# Patient Record
Sex: Female | Born: 1935 | Marital: Married | Smoking: Never smoker
Health system: Southern US, Community
[De-identification: ages and names within clinical notes are randomized; demographics above are authoritative.]

## PROBLEM LIST (undated history)

## (undated) DIAGNOSIS — D696 Thrombocytopenia, unspecified: Secondary | ICD-10-CM

## (undated) DIAGNOSIS — J45909 Unspecified asthma, uncomplicated: Secondary | ICD-10-CM

## (undated) DIAGNOSIS — D649 Anemia, unspecified: Secondary | ICD-10-CM

## (undated) DIAGNOSIS — E039 Hypothyroidism, unspecified: Secondary | ICD-10-CM

## (undated) HISTORY — DX: Unspecified asthma, uncomplicated: J45.909

## (undated) HISTORY — DX: Hypothyroidism, unspecified: E03.9

## (undated) HISTORY — DX: Thrombocytopenia, unspecified: D69.6

## (undated) HISTORY — DX: Anemia, unspecified: D64.9

---

## 2007-05-04 ENCOUNTER — Ambulatory Visit: Payer: Self-pay | Admitting: Emergency Medicine

## 2009-01-19 ENCOUNTER — Emergency Department: Payer: Self-pay | Admitting: Emergency Medicine

## 2010-04-20 ENCOUNTER — Ambulatory Visit: Payer: Self-pay | Admitting: Ophthalmology

## 2010-08-03 ENCOUNTER — Ambulatory Visit: Payer: Self-pay | Admitting: Ophthalmology

## 2010-09-09 ENCOUNTER — Emergency Department: Payer: Self-pay | Admitting: Emergency Medicine

## 2011-06-04 ENCOUNTER — Emergency Department: Payer: Self-pay | Admitting: Emergency Medicine

## 2012-04-27 ENCOUNTER — Ambulatory Visit: Payer: Self-pay | Admitting: Internal Medicine

## 2012-10-17 ENCOUNTER — Ambulatory Visit: Payer: Self-pay | Admitting: Oncology

## 2012-10-17 LAB — FERRITIN: Ferritin (ARMC): 123 ng/mL (ref 8–388)

## 2012-10-17 LAB — LACTATE DEHYDROGENASE: LDH: 228 U/L (ref 81–246)

## 2012-10-17 LAB — CBC CANCER CENTER
Basophil #: 0.1 x10 3/mm (ref 0.0–0.1)
Basophil %: 1.2 %
Eosinophil %: 1.1 %
HCT: 23.9 % — ABNORMAL LOW (ref 35.0–47.0)
Lymphocyte #: 3.3 x10 3/mm (ref 1.0–3.6)
Lymphocyte %: 53.7 %
MCH: 28.8 pg (ref 26.0–34.0)
MCV: 91 fL (ref 80–100)
Monocyte #: 0.7 x10 3/mm (ref 0.2–0.9)
Neutrophil #: 1.9 x10 3/mm (ref 1.4–6.5)
Platelet: 96 x10 3/mm — ABNORMAL LOW (ref 150–440)
RDW: 25.5 % — ABNORMAL HIGH (ref 11.5–14.5)

## 2012-10-17 LAB — IRON AND TIBC
Iron Bind.Cap.(Total): 316 ug/dL (ref 250–450)
Iron Saturation: 27 %
Unbound Iron-Bind.Cap.: 231 ug/dL

## 2012-10-22 ENCOUNTER — Ambulatory Visit: Payer: Self-pay | Admitting: Oncology

## 2012-11-21 ENCOUNTER — Ambulatory Visit: Payer: Self-pay | Admitting: Oncology

## 2012-12-03 LAB — CBC CANCER CENTER
Basophil #: 0.1 x10 3/mm (ref 0.0–0.1)
Basophil %: 0.9 %
Lymphocyte #: 4.1 x10 3/mm — ABNORMAL HIGH (ref 1.0–3.6)
MCHC: 31.5 g/dL — ABNORMAL LOW (ref 32.0–36.0)
MCV: 90 fL (ref 80–100)
Monocyte %: 13.8 %
Neutrophil #: 2 x10 3/mm (ref 1.4–6.5)
Neutrophil %: 27.4 %
Platelet: 76 x10 3/mm — ABNORMAL LOW (ref 150–440)
RBC: 2.26 10*6/uL — ABNORMAL LOW (ref 3.80–5.20)
WBC: 7.2 x10 3/mm (ref 3.6–11.0)

## 2012-12-22 ENCOUNTER — Ambulatory Visit: Payer: Self-pay | Admitting: Oncology

## 2012-12-25 LAB — CBC CANCER CENTER
Basophil #: 0 x10 3/mm (ref 0.0–0.1)
Basophil %: 0.7 %
Eosinophil #: 0.1 x10 3/mm (ref 0.0–0.7)
Eosinophil %: 0.9 %
HGB: 7.3 g/dL — ABNORMAL LOW (ref 12.0–16.0)
Lymphocyte %: 57.6 %
MCH: 29 pg (ref 26.0–34.0)
MCHC: 31.9 g/dL — ABNORMAL LOW (ref 32.0–36.0)
MCV: 91 fL (ref 80–100)
Monocyte %: 13 %
Neutrophil #: 1.8 x10 3/mm (ref 1.4–6.5)
Neutrophil %: 27.8 %
Platelet: 79 x10 3/mm — ABNORMAL LOW (ref 150–440)
RDW: 25.1 % — ABNORMAL HIGH (ref 11.5–14.5)
WBC: 6.4 x10 3/mm (ref 3.6–11.0)

## 2013-01-21 ENCOUNTER — Ambulatory Visit: Payer: Self-pay | Admitting: Oncology

## 2013-01-21 LAB — CBC CANCER CENTER
Basophil #: 0 x10 3/mm (ref 0.0–0.1)
HCT: 23.4 % — ABNORMAL LOW (ref 35.0–47.0)
HGB: 7.6 g/dL — ABNORMAL LOW (ref 12.0–16.0)
MCH: 29.6 pg (ref 26.0–34.0)
MCHC: 32.5 g/dL (ref 32.0–36.0)
MCV: 91 fL (ref 80–100)
Monocyte #: 0.9 x10 3/mm (ref 0.2–0.9)
Monocyte %: 14.8 %
Neutrophil #: 1.4 x10 3/mm (ref 1.4–6.5)
Neutrophil %: 22 %
Platelet: 62 x10 3/mm — ABNORMAL LOW (ref 150–440)
RBC: 2.58 10*6/uL — ABNORMAL LOW (ref 3.80–5.20)

## 2013-02-03 ENCOUNTER — Ambulatory Visit: Payer: Self-pay

## 2013-02-18 LAB — CBC CANCER CENTER
HCT: 23.5 % — ABNORMAL LOW (ref 35.0–47.0)
HGB: 7.6 g/dL — ABNORMAL LOW (ref 12.0–16.0)
Lymphocyte #: 5 x10 3/mm — ABNORMAL HIGH (ref 1.0–3.6)
Lymphocyte %: 59.9 %
MCV: 93 fL (ref 80–100)
Monocyte #: 1.1 x10 3/mm — ABNORMAL HIGH (ref 0.2–0.9)
Monocyte %: 13.6 %
Neutrophil #: 2.1 x10 3/mm (ref 1.4–6.5)
Neutrophil %: 25 %
Platelet: 66 x10 3/mm — ABNORMAL LOW (ref 150–440)
RBC: 2.54 10*6/uL — ABNORMAL LOW (ref 3.80–5.20)

## 2013-02-21 ENCOUNTER — Ambulatory Visit: Payer: Self-pay | Admitting: Oncology

## 2013-03-01 IMAGING — CT CT ABD-PELV W/ CM
1 of 2 series · 15 of 32 positions shown, 19 images · non-contrast
Comparison: none

REASON FOR EXAM: (1) RLQ/LLQ abdominal pain and vomiting; (2) RLQ/LLQ
abdominal pain and vomiting
COMMENTS:

PROCEDURE:     CT  - CT ABDOMEN / PELVIS  W  - June 05, 2011  [DATE]
RESULT:
TECHNIQUE: Helical 3 mm sections were obtained from the lung bases through
the pubic symphysis. This was status post intravenous administration of 100
mL of Rsovue-HBI and oral contrast.

[Series 2: 3mm soft tissue · axial · 0.85mm/px · z∈[-833,-425]mm · 15 of 150 slices shown, 19 images]
[im 7/150  soft-tissue]
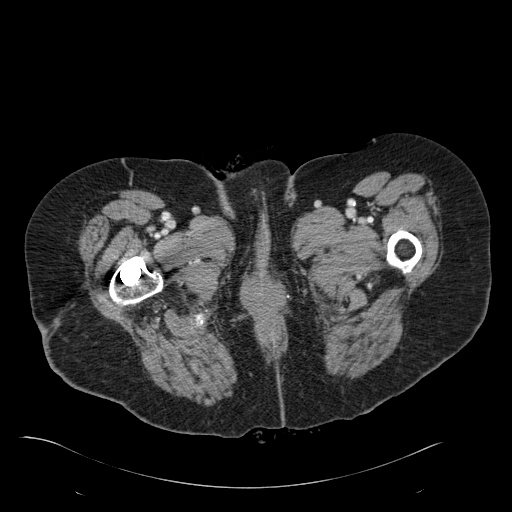
[im 7/150  bone]
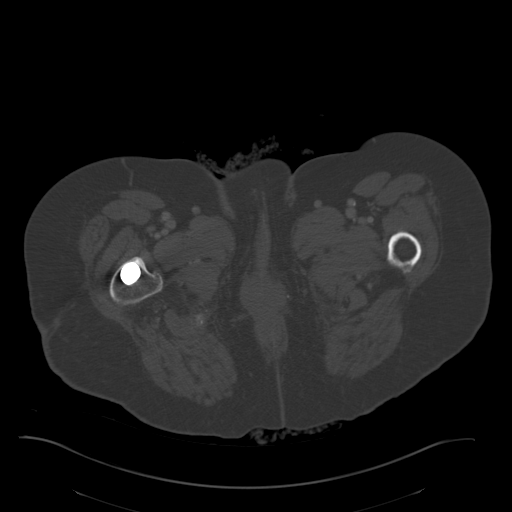
[im 20/150  soft-tissue]
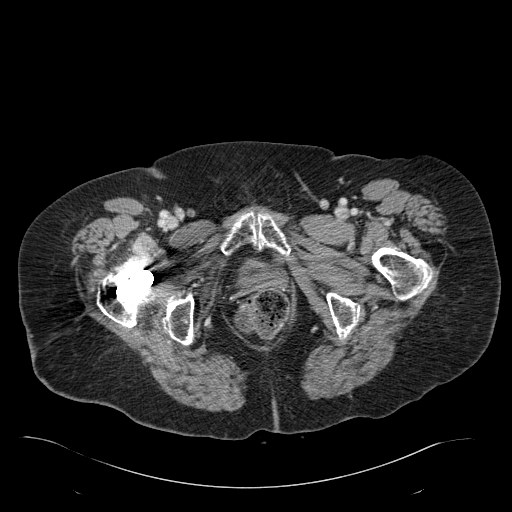
[im 33/150  soft-tissue]
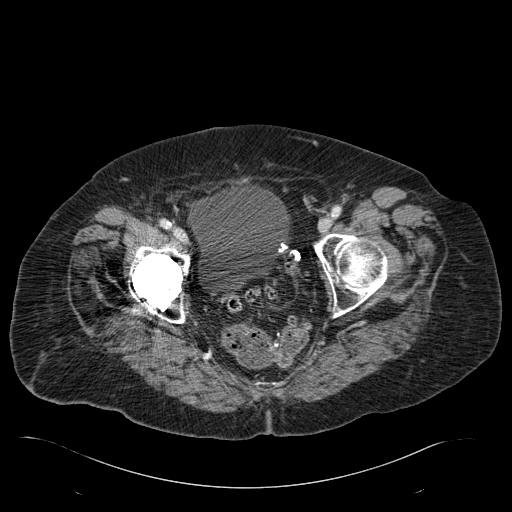
[im 39/150  soft-tissue]
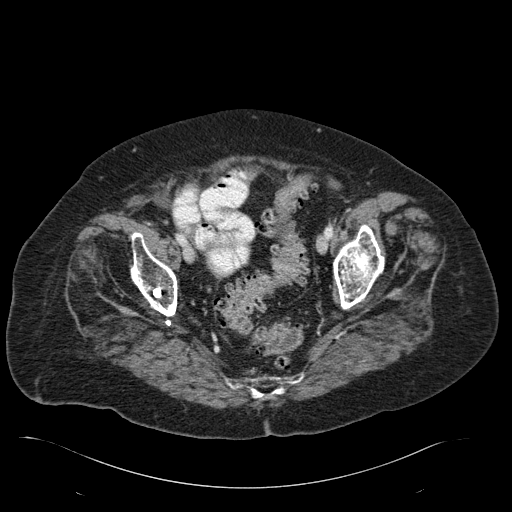
[im 52/150  soft-tissue]
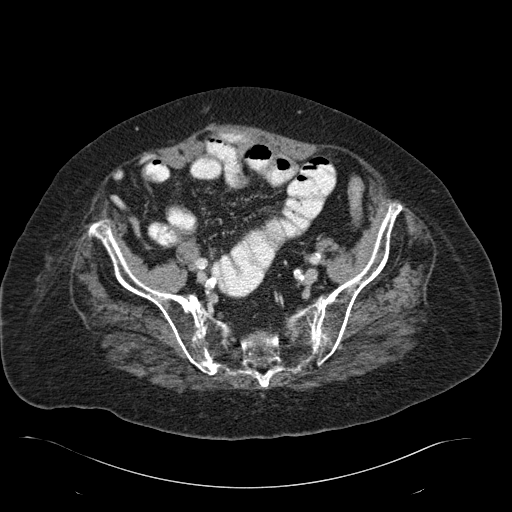
[im 65/150  soft-tissue]
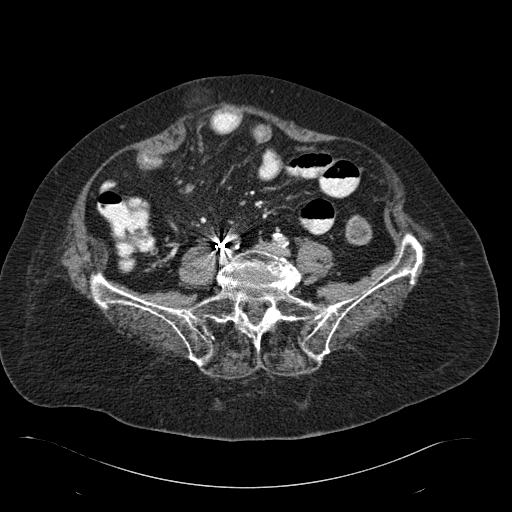
[im 78/150  soft-tissue]
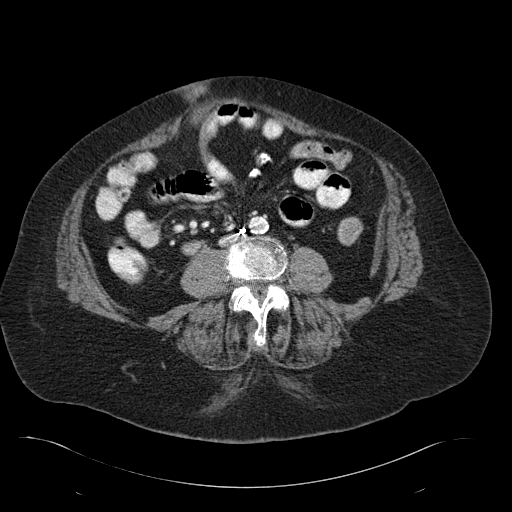
[im 85/150  soft-tissue]
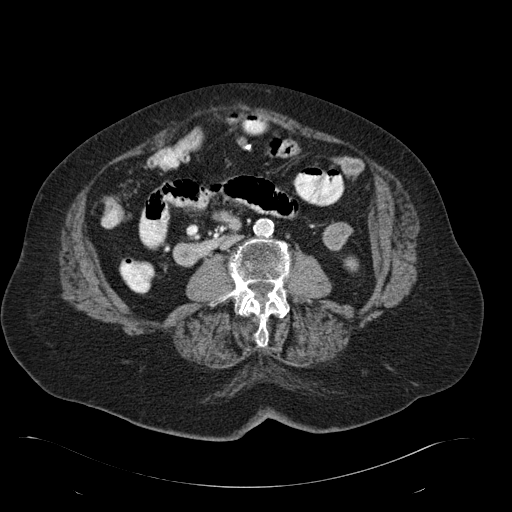
[im 98/150  soft-tissue]
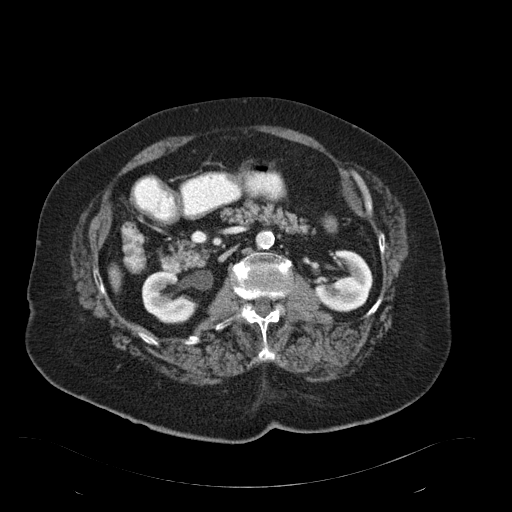
[im 98/150  bone]
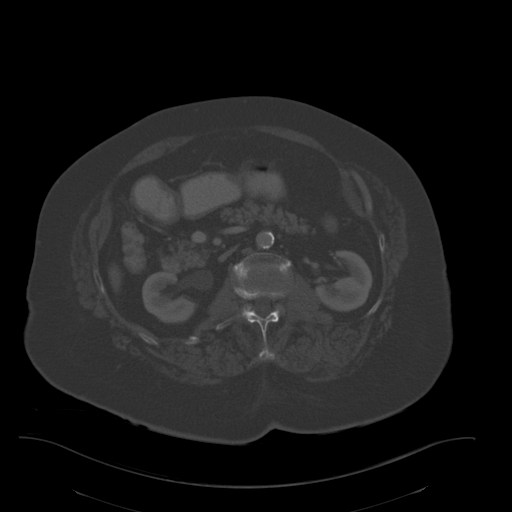
[im 111/150  soft-tissue]
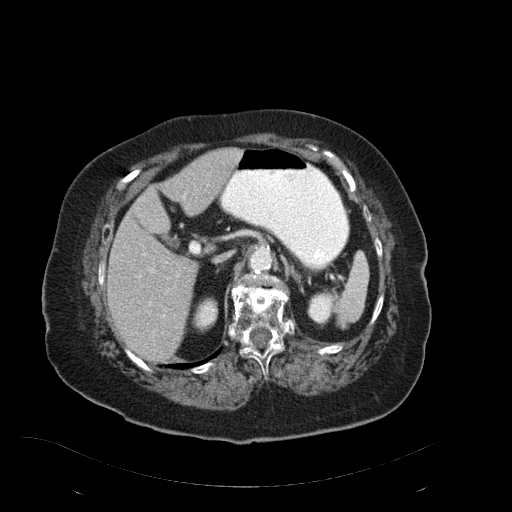
[im 117/150  soft-tissue]
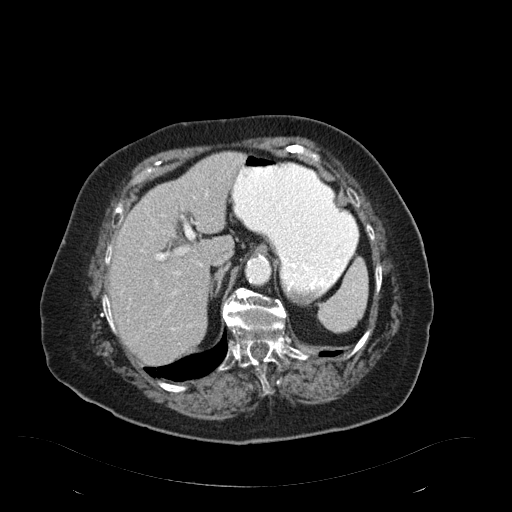
[im 124/150  lung]
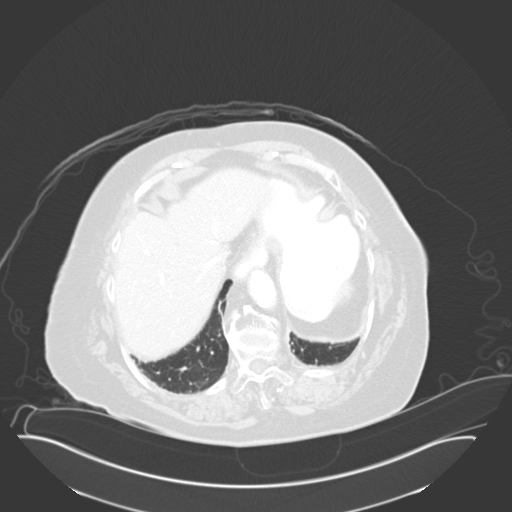
[im 130/150  soft-tissue]
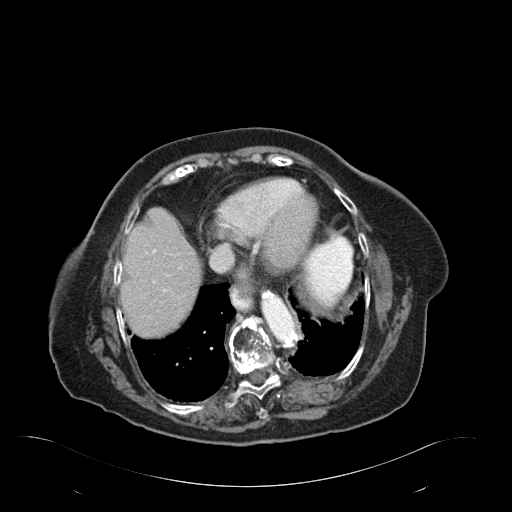
[im 130/150  lung]
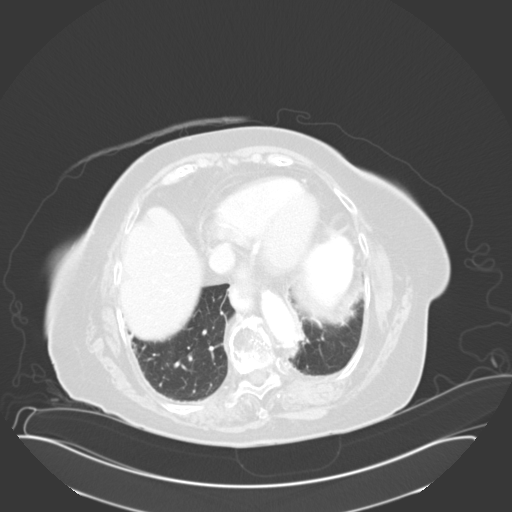
[im 137/150  lung]
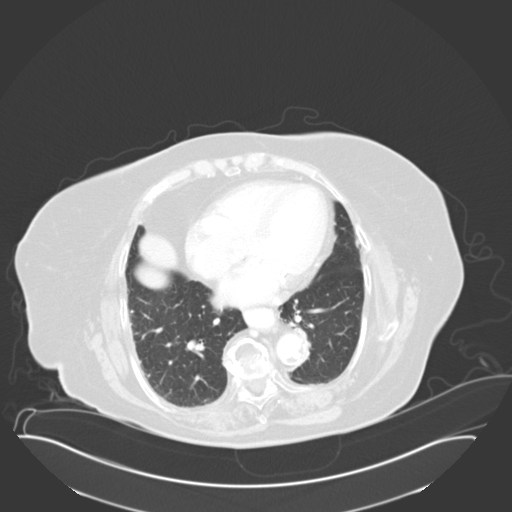
[im 143/150  soft-tissue]
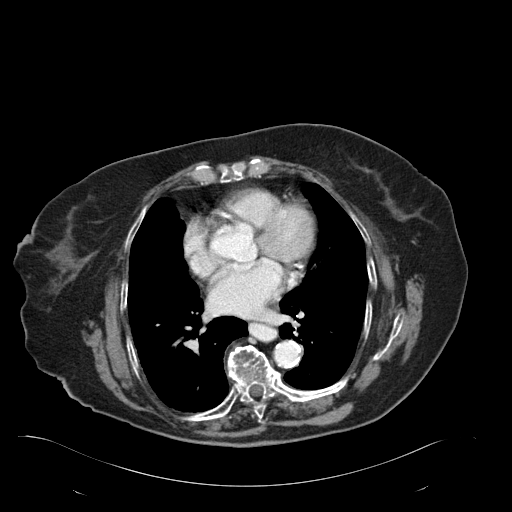
[im 143/150  lung]
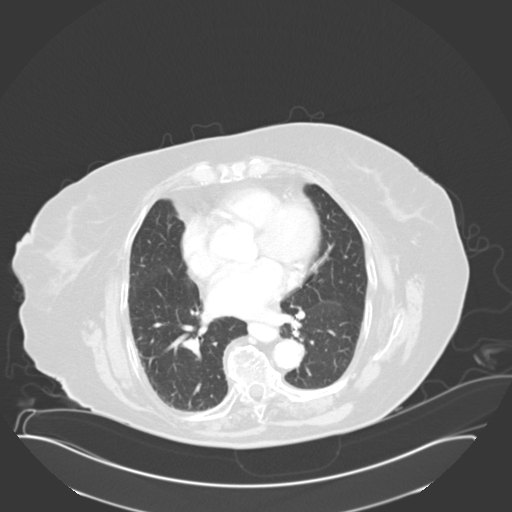

[15 of 32 positions shown; findings below may reference images not displayed]

FINDINGS: The lung bases are unremarkable.

The liver, spleen, adrenals, pancreas, and kidneys are unremarkable. A
ventral wall hernia is appreciated in the mid to lower right paracentral
region of the abdomen. This contains a loop of small bowel. There is no
evidence of bowel wall induration nor surrounding fluid. Loops of contrast
filled small bowel are appreciated within the abdomen. The bowel distal to
the area of herniation appears decompressed as well as the large bowel.
Severe diverticulosis is identified within the sigmoid colon.

There is no evidence of abdominal or pelvic free fluid, drainable loculated
fluid collections, masses or adenopathy. There is no evidence of an
abdominal aortic aneurysm.
IMPRESSION: 1. Ventral wall hernia containing loops of small bowel. There are distended
contrast-filled loops of small bowel proximal to the region of herniation
and these findings may represent the sequela of early or mild small bowel
obstruction.
2. Diverticulosis without CT evidence of diverticulitis.
3. Dr. Unsal of the Emergency Department was informed of these findings via
a preliminary faxed report.

## 2013-03-18 LAB — CBC CANCER CENTER
Basophil #: 0.1 x10 3/mm (ref 0.0–0.1)
Basophil %: 1 %
Eosinophil #: 0.1 x10 3/mm (ref 0.0–0.7)
Eosinophil %: 0.7 %
HGB: 7 g/dL — ABNORMAL LOW (ref 12.0–16.0)
Lymphocyte #: 5.4 x10 3/mm — ABNORMAL HIGH (ref 1.0–3.6)
Lymphocyte %: 61 %
MCHC: 32.3 g/dL (ref 32.0–36.0)
MCV: 95 fL (ref 80–100)
Monocyte #: 1.1 x10 3/mm — ABNORMAL HIGH (ref 0.2–0.9)
Neutrophil #: 2.2 x10 3/mm (ref 1.4–6.5)
RDW: 27.9 % — ABNORMAL HIGH (ref 11.5–14.5)

## 2013-03-24 ENCOUNTER — Ambulatory Visit: Payer: Self-pay | Admitting: Oncology

## 2013-04-29 ENCOUNTER — Ambulatory Visit: Payer: Self-pay | Admitting: Oncology

## 2013-04-29 LAB — CBC CANCER CENTER
Basophil #: 0.1 x10 3/mm (ref 0.0–0.1)
Basophil %: 0.9 %
Eosinophil %: 0.9 %
HCT: 24.5 % — ABNORMAL LOW (ref 35.0–47.0)
MCHC: 31.9 g/dL — ABNORMAL LOW (ref 32.0–36.0)
MCV: 96 fL (ref 80–100)
Monocyte #: 1.3 x10 3/mm — ABNORMAL HIGH (ref 0.2–0.9)
Monocyte %: 11.8 %
RBC: 2.55 10*6/uL — ABNORMAL LOW (ref 3.80–5.20)
RDW: 28.7 % — ABNORMAL HIGH (ref 11.5–14.5)
WBC: 10.6 x10 3/mm (ref 3.6–11.0)

## 2013-05-24 ENCOUNTER — Ambulatory Visit: Payer: Self-pay | Admitting: Oncology

## 2013-06-02 ENCOUNTER — Inpatient Hospital Stay: Payer: Self-pay | Admitting: Internal Medicine

## 2013-06-02 LAB — BASIC METABOLIC PANEL
Chloride: 107 mmol/L (ref 98–107)
EGFR (African American): 49 — ABNORMAL LOW
EGFR (Non-African Amer.): 43 — ABNORMAL LOW
Glucose: 110 mg/dL — ABNORMAL HIGH (ref 65–99)
Osmolality: 280 (ref 275–301)
Potassium: 4.3 mmol/L (ref 3.5–5.1)

## 2013-06-02 LAB — CBC
HCT: 22 % — ABNORMAL LOW (ref 35.0–47.0)
HGB: 7.2 g/dL — ABNORMAL LOW (ref 12.0–16.0)
MCH: 30.8 pg (ref 26.0–34.0)
MCHC: 32.7 g/dL (ref 32.0–36.0)
MCV: 94 fL (ref 80–100)
RBC: 2.33 10*6/uL — ABNORMAL LOW (ref 3.80–5.20)
RDW: 28.7 % — ABNORMAL HIGH (ref 11.5–14.5)
WBC: 8.6 10*3/uL (ref 3.6–11.0)

## 2013-06-02 LAB — TROPONIN I
Troponin-I: 0.19 ng/mL — ABNORMAL HIGH
Troponin-I: 0.19 ng/mL — ABNORMAL HIGH
Troponin-I: 0.18 ng/mL — ABNORMAL HIGH

## 2013-06-03 DIAGNOSIS — I369 Nonrheumatic tricuspid valve disorder, unspecified: Secondary | ICD-10-CM

## 2013-06-03 DIAGNOSIS — D469 Myelodysplastic syndrome, unspecified: Secondary | ICD-10-CM

## 2013-06-03 DIAGNOSIS — R0609 Other forms of dyspnea: Secondary | ICD-10-CM

## 2013-06-03 DIAGNOSIS — D649 Anemia, unspecified: Secondary | ICD-10-CM

## 2013-06-03 DIAGNOSIS — R7989 Other specified abnormal findings of blood chemistry: Secondary | ICD-10-CM

## 2013-06-03 DIAGNOSIS — R0989 Other specified symptoms and signs involving the circulatory and respiratory systems: Secondary | ICD-10-CM

## 2013-06-03 DIAGNOSIS — E039 Hypothyroidism, unspecified: Secondary | ICD-10-CM

## 2013-06-03 LAB — TSH: Thyroid Stimulating Horm: 0.123 u[IU]/mL — ABNORMAL LOW

## 2013-06-03 LAB — PRO B NATRIURETIC PEPTIDE: B-Type Natriuretic Peptide: 1702 pg/mL — ABNORMAL HIGH (ref 0–450)

## 2013-06-04 DIAGNOSIS — I4891 Unspecified atrial fibrillation: Secondary | ICD-10-CM

## 2013-06-04 LAB — BASIC METABOLIC PANEL
Anion Gap: 6 — ABNORMAL LOW (ref 7–16)
Chloride: 107 mmol/L (ref 98–107)
Co2: 28 mmol/L (ref 21–32)
Creatinine: 0.97 mg/dL (ref 0.60–1.30)
EGFR (Non-African Amer.): 49 — ABNORMAL LOW
Glucose: 93 mg/dL (ref 65–99)
Osmolality: 285 (ref 275–301)
Potassium: 4 mmol/L (ref 3.5–5.1)
Sodium: 141 mmol/L (ref 136–145)

## 2013-06-05 LAB — CBC WITH DIFFERENTIAL/PLATELET
Basophil #: 0.1 10*3/uL (ref 0.0–0.1)
Basophil %: 1.1 %
Eosinophil #: 0 10*3/uL (ref 0.0–0.7)
Eosinophil %: 0.4 %
HCT: 21.8 % — ABNORMAL LOW (ref 35.0–47.0)
HGB: 7.1 g/dL — ABNORMAL LOW (ref 12.0–16.0)
Lymphocyte #: 6.3 10*3/uL — ABNORMAL HIGH (ref 1.0–3.6)
Lymphocyte %: 66 %
MCH: 30.9 pg (ref 26.0–34.0)
MCHC: 32.4 g/dL (ref 32.0–36.0)
MCV: 95 fL (ref 80–100)
Monocyte #: 1.6 x10 3/mm — ABNORMAL HIGH (ref 0.2–0.9)
Monocyte %: 17.3 %
Neutrophil #: 1.5 10*3/uL (ref 1.4–6.5)
Neutrophil %: 15.2 %
RBC: 2.29 10*6/uL — ABNORMAL LOW (ref 3.80–5.20)
RDW: 28.6 % — ABNORMAL HIGH (ref 11.5–14.5)

## 2013-06-06 LAB — CBC WITH DIFFERENTIAL/PLATELET
HGB: 8.5 g/dL — ABNORMAL LOW (ref 12.0–16.0)
MCHC: 33 g/dL (ref 32.0–36.0)
MCV: 93 fL (ref 80–100)
NRBC/100 WBC: 3 /
Platelet: 52 10*3/uL — ABNORMAL LOW (ref 150–440)
RDW: 25.9 % — ABNORMAL HIGH (ref 11.5–14.5)
Segmented Neutrophils: 16 %
WBC: 11.9 10*3/uL — ABNORMAL HIGH (ref 3.6–11.0)

## 2013-06-07 ENCOUNTER — Ambulatory Visit: Payer: Self-pay | Admitting: Oncology

## 2013-06-10 ENCOUNTER — Telehealth: Payer: Self-pay

## 2013-06-10 NOTE — Telephone Encounter (Signed)
Patient contacted regarding discharge from [facility] on 06/05/13.  Patient understands to follow up with provider Dr. Elease Hashimoto on 06/12/13 at 9:15 at [facility]. Patient understands discharge instructions? yes Patient understands medications and regiment? yes Patient understands to bring all medications to this visit? yes  Spoke w/ gentleman who states that pt "doesn't live here.  The lady who is her power of attorney does, but she is in the shower." Verbalizes understanding that pt has appt on 11/20.  States that she will be here and will have POA call with any questions.

## 2013-06-11 ENCOUNTER — Telehealth: Payer: Self-pay

## 2013-06-11 ENCOUNTER — Encounter: Payer: Self-pay | Admitting: *Deleted

## 2013-06-11 NOTE — Telephone Encounter (Signed)
Medical Records at [facility] called and states that there is something in the medical records that he needs to address.

## 2013-06-12 ENCOUNTER — Encounter: Payer: Medicare Other | Admitting: Cardiovascular Disease

## 2013-06-12 ENCOUNTER — Ambulatory Visit (INDEPENDENT_AMBULATORY_CARE_PROVIDER_SITE_OTHER): Payer: Medicare Other | Admitting: Cardiovascular Disease

## 2013-06-12 ENCOUNTER — Encounter: Payer: Self-pay | Admitting: Cardiovascular Disease

## 2013-06-12 VITALS — BP 124/60 | HR 67 | Ht 60.0 in | Wt 146.0 lb

## 2013-06-12 DIAGNOSIS — R7989 Other specified abnormal findings of blood chemistry: Secondary | ICD-10-CM

## 2013-06-12 NOTE — Patient Instructions (Signed)
Your physician recommends that you follow-up as needed. 

## 2013-06-12 NOTE — Assessment & Plan Note (Signed)
Mrs. Kromer was admitted to the hospital with shortness of breath and jitteriness.  She was found to have severe anemia and significant hyperthyroidism do to excessive Synthroid dose. She had mildly elevated troponin levels.  Given her age and comorbidities, she's not a candidate for invasive cardiac procedures. We started her on metoprolol and she's feeling better. He also had decreased the dose of her Synthroid.  I suspect the troponin elevation was a supply/demand mismatch as of her anemia and hypothyroidism. She never had any episodes of chest pain.  She should continue conservative treatment.  We'll continue with the dose of metoprolol as tolerated. As her thyroid levels normalize, we may need to decrease the dose of her metoprolol slightly. We will have her followup with her general medical Dr. for this decision-making. I'll see her on an as-needed basis.

## 2013-06-12 NOTE — Progress Notes (Signed)
Jackie Hamilton Date of Birth  1912-12-13       [facility] Office [address], [address]  [address] [place], [place]  [address]   [place], [place]  [address] [phone]     [phone]   Fax  [phone]    Fax [phone]  Problem List: 1. Asthma 2, anemia 3. + Troponin ( while in [facility]) 4. Hypothyroidism   History of Present Illness:  Jackie Hamilton was admitted to [facility] with jitters and and shortness of breath.  She was anemic.  She has been getting blood transfusions regularly.  She was found to have a very low TSH and her synthroid dose was decreased. She had mildly elevated troponin levesl ( which is why we were consulted).   I thought that she likely had demand ischemia in the setting of an asthma attic and severe anemia.    Nov. 20,  2014:    She has not had any chest pain.  Breathing is OK  .  She feels tired.    Current Outpatient Prescriptions  Medication Sig Dispense Refill  . acetaminophen (TYLENOL) 500 MG tablet Take 500 mg by mouth every 6 (six) hours as needed.      Marland Kitchen albuterol (PROVENTIL HFA;VENTOLIN HFA) 108 (90 BASE) MCG/ACT inhaler Inhale 2 puffs into the lungs 3 (three) times daily as needed for wheezing or shortness of breath.      . beclomethasone (QVAR) 80 MCG/ACT inhaler Inhale 2 puffs into the lungs 3 (three) times daily as needed.      . Cholecalciferol (VITAMIN D3) 2000 UNITS capsule Take 2,000 Units by mouth daily.      . fluticasone (FLONASE) 50 MCG/ACT nasal spray Place 2 sprays into both nostrils daily.      Marland Kitchen levothyroxine (SYNTHROID, LEVOTHROID) 50 MCG tablet Take 50 mcg by mouth daily before breakfast.      . metoprolol tartrate (LOPRESSOR) 25 MG tablet Take 25 mg by mouth 2 (two) times daily.      . nitroGLYCERIN (NITROSTAT) 0.4 MG SL tablet Place 0.4 mg under the tongue every 5 (five) minutes as needed for chest pain.      Marland Kitchen nystatin (MYCOSTATIN) 100000 UNIT/ML suspension Take 5 mLs by mouth 4  (four) times daily as needed.      . vitamin B-12 (CYANOCOBALAMIN) 1000 MCG tablet Take 1,000 mcg by mouth daily.      . vitamin C (ASCORBIC ACID) 500 MG tablet Take 500 mg by mouth daily.       No current facility-administered medications for this visit.     Allergies  Allergen Reactions  . Ciprofloxacin   . Codeine   . Sulfa Antibiotics     Past Medical History  Diagnosis Date  . Asthma   . Chronic anemia   . Thrombocytopenia   . Hypothyroidism     No past surgical history on file.  History  Smoking status  . Never Smoker   Smokeless tobacco  . Not on file    History  Alcohol Use No    No family history on file.  Reviw of Systems:  Reviewed in the HPI.  All other systems are negative.  Physical Exam: Blood pressure 124/60, pulse 67, height 5' (1.524 m), weight 146 lb (66.225 kg), SpO2 95.00%. General: Well developed, well nourished, in no acute distress.  Head: Normocephalic, atraumatic, sclera non-icteric, mucus membranes are moist,   Neck: Supple. Carotids are 2 +  without bruits. No JVD   Lungs: Clear   Heart: RR, soft systolic murmur  Abdomen: Soft, non-tender, non-distended with normal bowel sounds.  Msk:  Strength and tone are normal   Extremities: No clubbing or cyanosis. No edema.  Distal pedal pulses are 2+ and equal    Neuro: CN II - XII intact.  Alert and oriented X 3.   Psych:  Normal   ECG:   Assessment / Plan:

## 2013-06-23 ENCOUNTER — Ambulatory Visit: Payer: Self-pay | Admitting: Oncology

## 2013-07-24 DEATH — deceased

## 2014-01-21 NOTE — Telephone Encounter (Signed)
This encounter was created in error - please disregard.

## 2014-10-31 IMAGING — CR DG CHEST 2V
1 series · 2 of 2 positions shown · non-contrast
Comparison: none

REASON FOR EXAM: productive cough, chest congestion, hx of asthma
COMMENTS:

PROCEDURE:     MDR - MDR CHEST PA(OR AP) AND LATERAL  - February 03, 2013 [DATE]
RESULT:     Comparison: 04/27/2012

[Series 1: pa · 0.17mm/px · 2 of 2 slices shown]
[im 1/2]
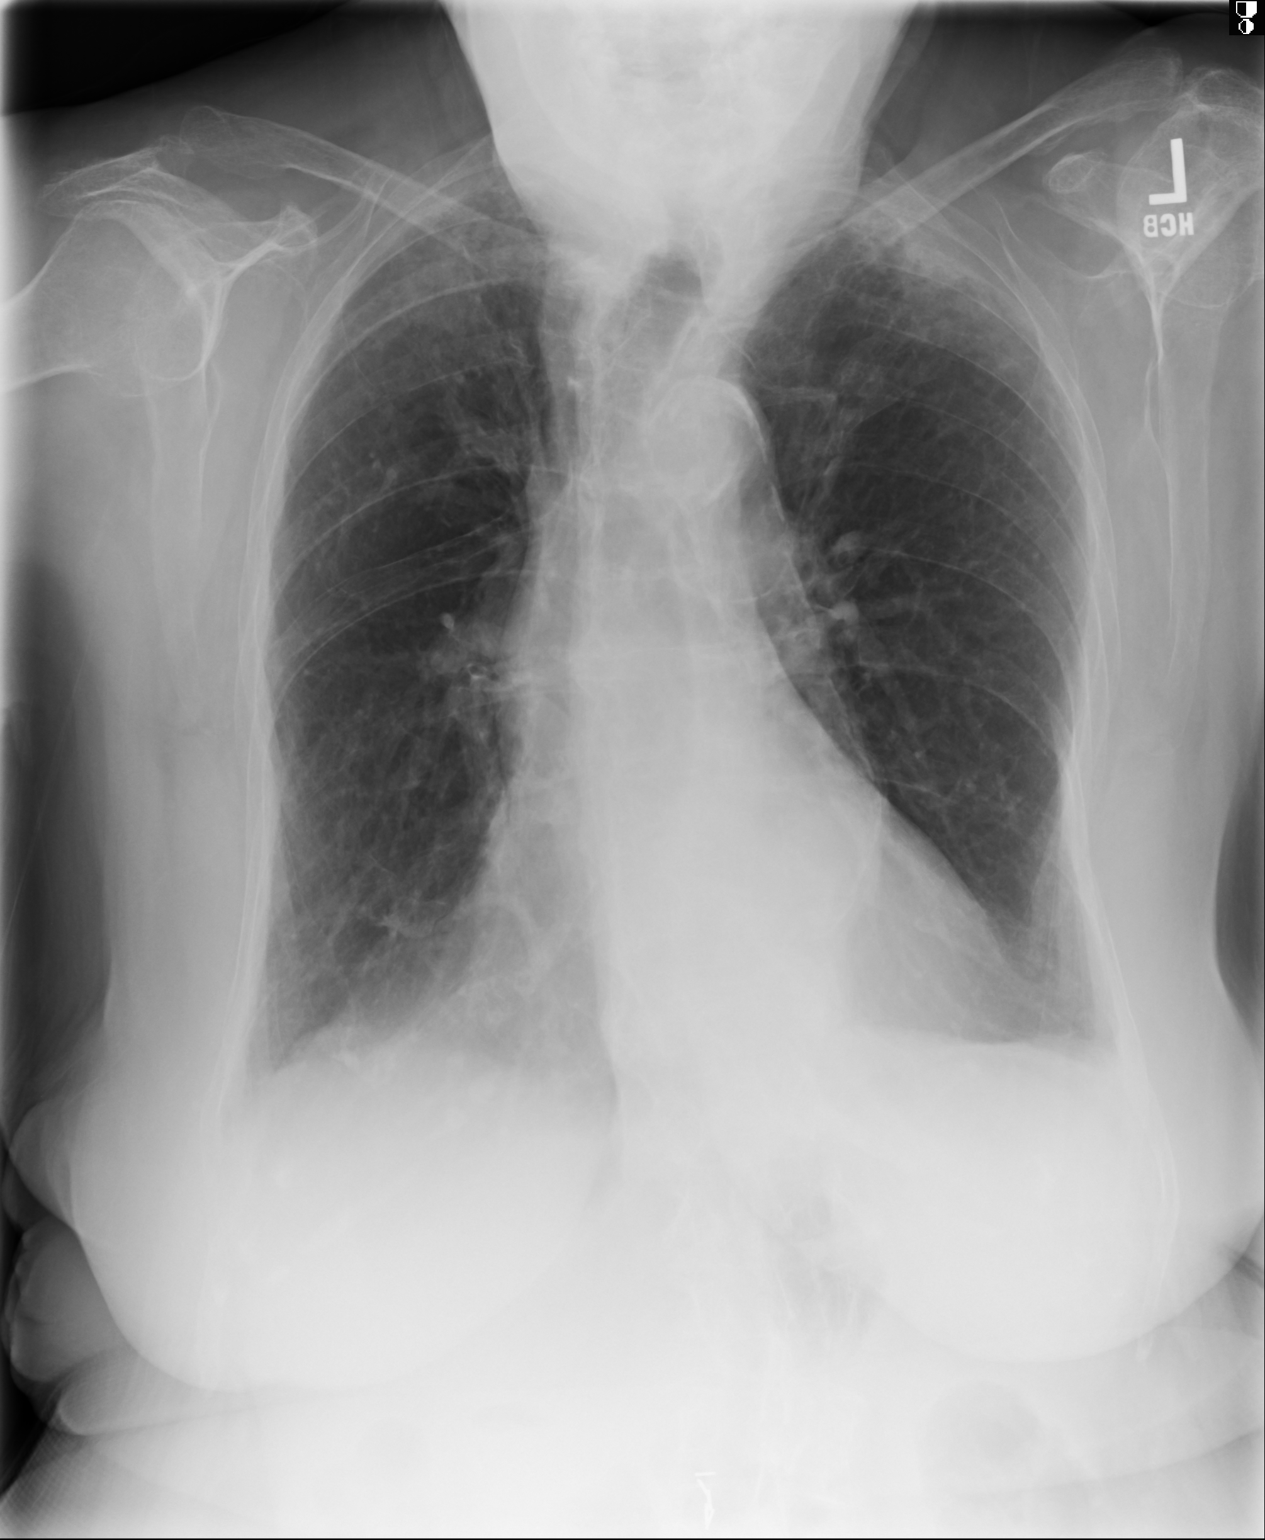
[im 2/2]
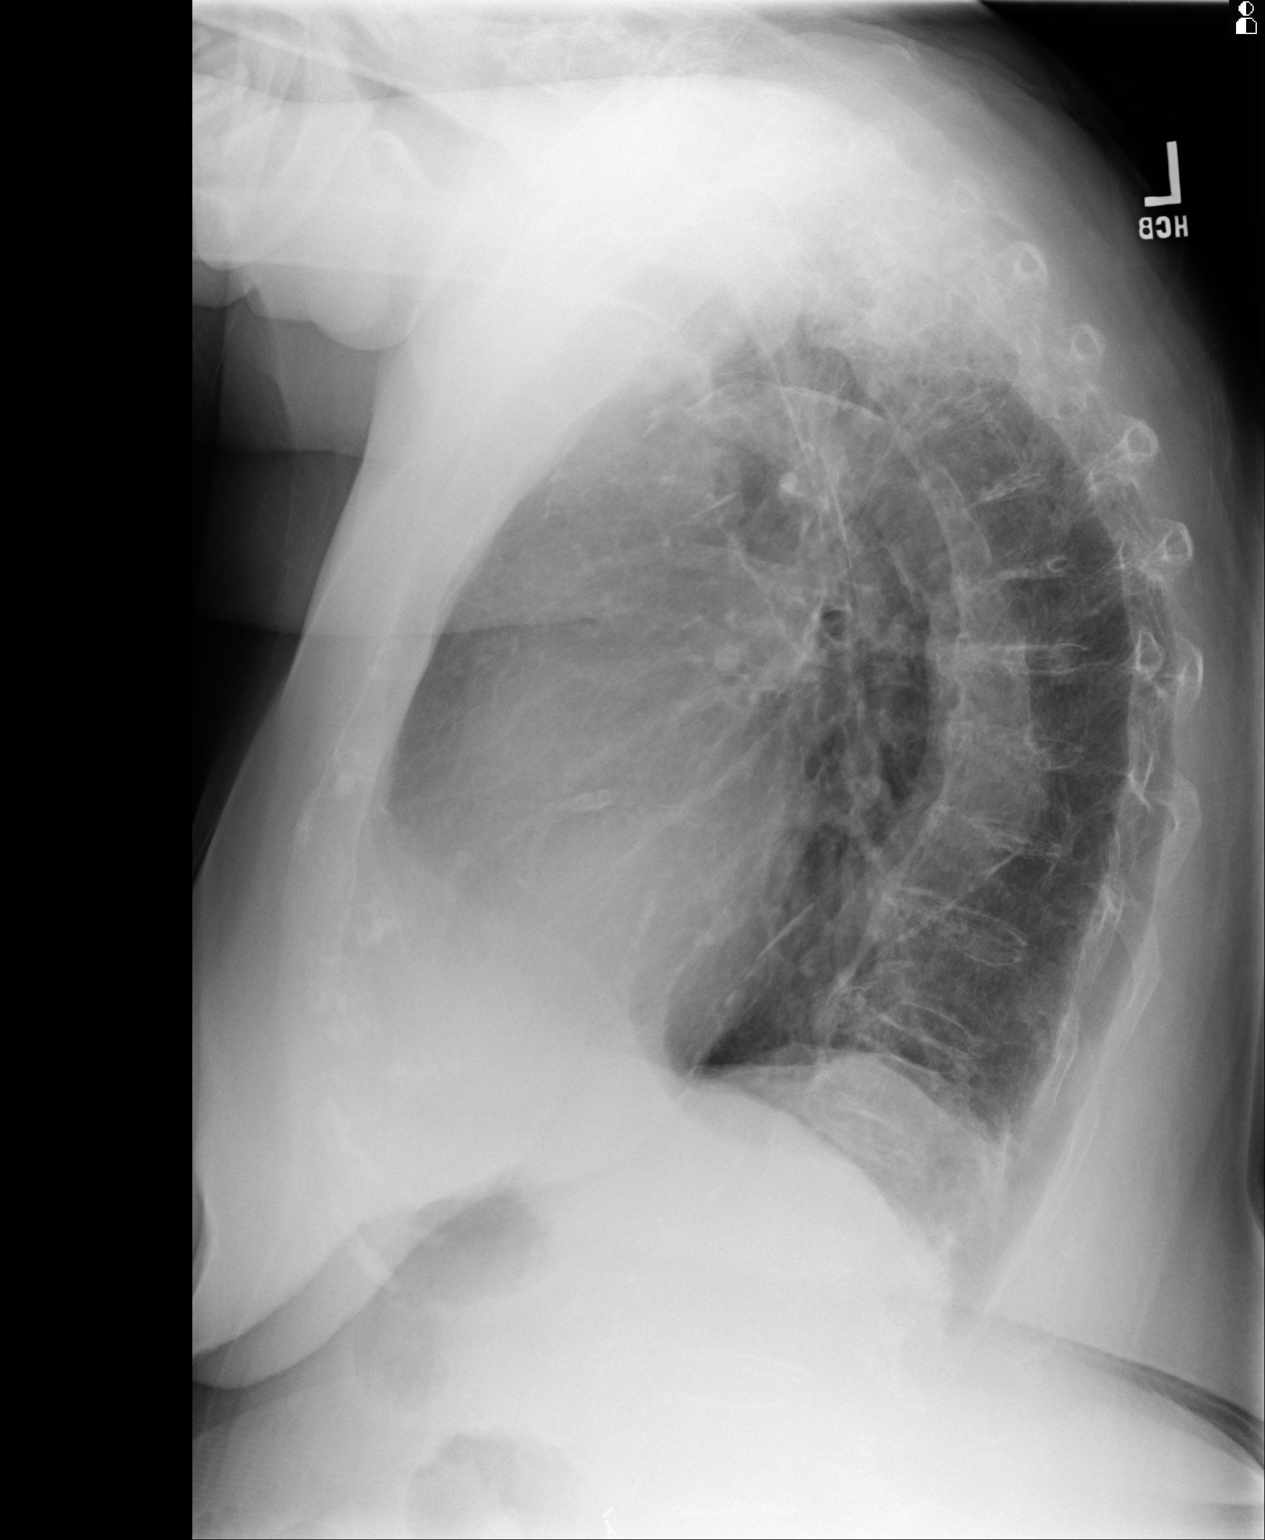

[2 of 2 positions shown; findings below may reference images not displayed]

FINDINGS: The heart and mediastinum are stable. Biapical opacities are similar to
prior and likely represent pleural-parenchymal thickening. Otherwise, no
focal pulmonary opacities.
IMPRESSION: 1. No acute cardiopulmonary disease.
2. Biapical opacities are similar to prior and likely due to biapical
pleural-parenchymal thickening/scarring. However, continued followup is
recommended to ensure stability.

[REDACTED]

## 2014-11-13 NOTE — Consult Note (Signed)
History of Present Illness:  Reason for Consult Symptomatic anemia.   HPI   Patient admitted to the hospital with increasing shortness of breath and was found to have mildly elevated cardiac enzymes.  Her hemoglobin was also significantly decreased at 7.1.  Patient is chronically weak and fatigued and receives blood transfusions as an outpatient approximately every 4-6 weeks.  She denies any easy bleeding or bruising.  She has no neurologic complaints.  She denies any recent fevers.  She has a fair appetite and denies weight loss.  She denies any chest pain.  She has no nausea, vomiting, constipation, or diarrhea.  She has no urinary complaints.  Patient offers no further specific complaints today.  PFSH:  Additional Past Medical and Surgical History asthma, hypothyroidism, diverticulosis, right hip replacement, total hysterectomy, appendectomy, cholecystectomy.  Family history: Noncontributory.  Social history: Patient denies tobacco or alcohol.   Review of Systems:  Performance Status (ECOG) 2   Review of Systems   As per HPI. Otherwise, 10 point system review was negative.   NURSING NOTES: **Vital Signs.:   13-Nov-14 11:44   Vital Signs Type: Routine   Temperature Temperature (F): 98.6   Celsius: 37   Temperature Source: oral   Pulse Pulse: 64   Respirations Respirations: 18   Systolic BP Systolic BP: 355   Diastolic BP (mmHg) Diastolic BP (mmHg): 63   Mean BP: 81   Pulse Ox % Pulse Ox %: 92   Pulse Ox Activity Level: At rest   Oxygen Delivery: Room Air/ 21 %   Physical Exam:  Physical Exam General: Well-developed, well-nourished, no acute distress. Eyes: Pink conjunctiva, anicteric sclera. Lungs: Clear to auscultation bilaterally. Heart: Regular rate and rhythm. No rubs, murmurs, or gallops. Abdomen: Soft, nontender, nondistended. No organomegaly noted, normoactive bowel sounds. Musculoskeletal: No edema, cyanosis, or clubbing. Neuro: Alert, answering  all questions appropriately. Cranial nerves grossly intact. Skin: ecchymosis noted on both arms Psych: Normal affect.    Codeine: Unknown  Sulfa drugs: Unknown  Cipro: GI Distress    nitroglycerin 0.4 mg sublingual tablet: 1 tab(s) sublingual , As needed, chest pain, Status: Active, Quantity: 0, Refills: None   metoprolol tartrate 25 mg oral tablet: 1 tab(s) orally 2 times a day, Status: Active, Quantity: 0, Refills: None   nystatin 100000 units/mL oral suspension: 5 milliliter(s) orally 4 times a day, As Needed, Status: Active, Quantity: 0, Refills: None   Qvar 80 mcg/inh inhalation aerosol: 1 puff(s) inhaled 2 times a day, Status: Active, Quantity: 0, Refills: None   ProAir HFA CFC free 90 mcg/inh inhalation aerosol: 2 puff(s) inhaled 3 times a day, As Needed - for Wheezing, Status: Active, Quantity: 0, Refills: None   Flonase 50 mcg/inh nasal spray: 2 spray(s) nasal once a day, Status: Active, Quantity: 0, Refills: None   Vitamin D3 2000 intl units oral capsule: 1 cap(s) orally once a day, Status: Active, Quantity: 0, Refills: None   Mapap 500 mg oral tablet: 1 tab(s) orally every 6 hours, As Needed, Status: Active, Quantity: 0, Refills: None   Mucinex 600 mg oral tablet, extended release: 1 tab(s) orally every 12 hours, As Needed, Status: Active, Quantity: 0, Refills: None   Vitamin B-12 1000 mcg oral tablet: 1 tab(s) orally once a day, Status: Active, Quantity: 0, Refills: None   Vitamin C 500 mg oral tablet: 1 tab(s) orally once a day, Status: Active, Quantity: 0, Refills: None  Laboratory Results: Routine Hem:  13-Nov-14 06:21   WBC (CBC) 9.5  RBC (CBC)  2.29  Hemoglobin (CBC)  7.1  Hematocrit (CBC)  21.8  Platelet Count (CBC)  54  MCV 95  MCH 30.9  MCHC 32.4  RDW  28.6  Neutrophil % 15.2  Lymphocyte % 66.0  Monocyte % 17.3  Eosinophil % 0.4  Basophil % 1.1  Neutrophil # 1.5  Lymphocyte #  6.3  Monocyte #  1.6  Eosinophil # 0.0  Basophil # 0.1 (Result(s)  reported on 05 Jun 2013 at 08:37AM.)   Assessment and Plan: Impression:   Anemia and thrombocytopenia, likely MDS. Plan:   1.  Anemia: Although patient's hemoglobin is greater than 7.0, I believe she will benefit from a blood transfusion given her presentation or shortness of breath as well as mildly increased troponin levels.  Given her history, her hemoglobin will likely continue to drop.  Because of her age and difficulty getting back and forth to the hospital, patient will benefit from one unit of packed red blood cells to minimize transportation.  Her most likely diagnosis is MDS, but no bone marrow biopsy is necessary since the treatment for a 82 year old would be supportive care. Thrombocytopenia: Slightly below her baseline, monitor. Disposition: Likely discharge to rehabilitation tomorrow. consult, call questions.  CC Referral:  cc: Dr. Harvel Ricks in [place], [place]   Electronic Signatures: Delight Hoh (MD)  (Signed [phone] 16:41)  Authored: HISTORY OF PRESENT ILLNESS, PFSH, ROS, NURSING NOTES, PE, ALLERGIES, HOME MEDICATIONS, LABS, ASSESSMENT AND PLAN, CC Referring Physician   Last Updated: 13-Nov-14 16:41 by Delight Hoh (MD)

## 2014-11-13 NOTE — Consult Note (Signed)
General Aspect Jackie Hamilton is a 81yo Caucasian female, resident of [facility] assisted living, w/ PMHx s/f MDS-> chronic anemia/thrombocytopenia, asthma and hypothyroidism who was admitted to [facility] yesterday for shortness of breath.  She follows Dr. Grayland Ormond for MDS. Iron stores WNL earlier this year. Hgb 7.8 on 04/29/13. She has received iron infusions in the past for underlying anemia. Supportive care planned (as opposed to bone marrow biopsy for further work-up).   Her relative is by the bedside and helped to supplement the history. She has no prior cardiac history. No heart attacks, heart caths or cardiac surgery. Denies prior chest pain. She ambulates with a walker at baseline. She was in her USOH until 4-5 days ago when she noticed increased dyspnea on exertion and shortness of breath. She had been treated for a bronchitis 3 weeks prior which had just resolved. She did note wheezing c/w prior episodes of asthma. She endorses chronc 2 -pillow orthopnea. She denies chest pain, PND, LE edema, abdominal distention or weight gain. No prior tobacco abuse or dx of COPD. She began experiencing dyspnea on minimal exertion and presented to [facility] ED. Symptoms resolved prior to presenting to the ED.   Present Illness In the ED, EKG revealed NSR, 1st degree AVB, inferior IVCD, no ST/T changes. Initial TnI 0.19. BMP- BUN 29/Cr 1.08. CBC- Hgb 7.2/Hct 22.0, PLT 53K. MCV WNL. CXR- no acute cardiopulmonary process, no significant interval change biapical opacities potentially representing scarring. Attention on followup to ensure stability. VS- O2 sat 92-97% on RA, BP 131-181/60-81, RR 20-22, HR 80-90s. Afebrile. She was admitted by the medicine team   PAST MEDICAL HISTORY: Consistent with asthma, MDS with resultant chronic anemia, thrombocytopenia, hypothyroidism.   ALLERGIES: CIPRO, CODEINE, AND SULFA DRUGS.   SOCIAL HISTORY: No smoking. No alcohol abuse. No illicit drug abuse. Lives at [facility]  assisted living.   FAMILY HISTORY: Both mother and father are deceased. They both died from complications of old age. No family history of coronary artery disease or diabetes.   Physical Exam:  GEN no acute distress, elderly,   HEENT pale conjunctivae, PERRL, hearing intact to voice   NECK supple  No masses  trachea midline  no JVD or bruits   RESP normal resp effort  scattered wheezing appreciated, no rales or rhonchi   CARD Regular rate and rhythm  Normal, S1, S2  No murmur   ABD positive hernia  soft  normal BS  no Adominal Mass  mild epigastric tenderness at hernia site   EXTR negative cyanosis/clubbing, negative edema   SKIN normal to palpation   NEURO follows commands, motor/sensory function intact   PSYCH alert, A+O to time, place, person   Review of Systems:  Subjective/Chief Complaint shortness of breath   Respiratory: Short of breath  Wheezing   Cardiovascular: Orthopnea  DOE   Vascular: Leg cramping   Review of Systems: All other systems were reviewed and found to be negative   Home Medications: Medication Instructions Status  levothyroxine 75 mcg (0.075 mg) oral tablet 1 tab(s) orally once a day Active  Vitamin B-12 1000 mcg oral tablet 1 tab(s) orally once a day Active  Vitamin C 500 mg oral tablet 1 tab(s) orally once a day Active  Qvar 80 mcg/inh inhalation aerosol 1 puff(s) inhaled 2 times a day Active  ProAir HFA CFC free 90 mcg/inh inhalation aerosol 2 puff(s) inhaled 3 times a day, As Needed - for Wheezing Active  Flonase 50 mcg/inh nasal spray 2 spray(s) nasal  once a day Active  Vitamin D3 2000 intl units oral capsule 1 cap(s) orally once a day Active  Mapap 500 mg oral tablet 1 tab(s) orally every 6 hours, As Needed Active  Mucinex 600 mg oral tablet, extended release 1 tab(s) orally every 12 hours, As Needed Active  nystatin 100000 units/mL oral suspension 5 milliliter(s) orally 4 times a day, As Needed Active   Lab Results:  Routine Chem:   10-Nov-14 14:36   Result Comment TROPONIN - RESULTS VERIFIED BY REPEAT TESTING.  - C/TO Jackie Hamilton, ER AT [id number]  - 06/02/2013 BY TFK.  - READ-BACK PROCESS PERFORMED.  Result(s) reported on 02 Jun 2013 at 04:10PM.  Result Comment HEM/PLT - NRBCS SEEN ON SMEAR  Result(s) reported on 02 Jun 2013 at 03:19PM.  Glucose, Serum  110  BUN  29  Creatinine (comp) 1.08  Sodium, Serum 137  Potassium, Serum 4.3  Chloride, Serum 107  Calcium (Total), Serum 10.0  Anion Gap  2  Osmolality (calc) 280  eGFR (African American)  49  eGFR (Non-African American)  43 (eGFR values <59m/min/1.73 m2 may be an indication of chronic kidney disease (CKD). Calculated eGFR is useful in patients with stable renal function. The eGFR calculation will not be reliable in acutely ill patients when serum creatinine is changing rapidly. It is not useful in  patients on dialysis. The eGFR calculation may not be applicable to patients at the low and high extremes of body sizes, pregnant women, and vegetarians.)    19:39   Result Comment TROPONIN - RESULTS VERIFIED BY REPEAT TESTING.  - PREVIOUS CALL:06/02/13 AT 1607..Marland KitchenMarland KitchenPL  Result(s) reported on 02 Jun 2013 at 08:31PM.    22:03   Result Comment TROPONIN - RESULTS VERIFIED BY REPEAT TESTING.  - PREV. C/ 06-02-13 _0  BY TFK. AJO  Result(s) reported on 02 Jun 2013 at 11:11PM.  Cardiac:  10-Nov-14 14:36   Troponin I  0.19 (0.00-0.05 0.05 ng/mL or less: NEGATIVE  Repeat testing in 3-6 hrs  if clinically indicated. >0.05 ng/mL: POTENTIAL  MYOCARDIAL INJURY. Repeat  testing in 3-6 hrs if  clinically indicated. NOTE: An increase or decrease  of 30% or more on serial  testing suggests a  clinically important change)    19:39   Troponin I  0.19 (0.00-0.05 0.05 ng/mL or less: NEGATIVE  Repeat testing in 3-6 hrs  if clinically indicated. >0.05 ng/mL: POTENTIAL  MYOCARDIAL INJURY. Repeat  testing in 3-6 hrs if  clinically indicated. NOTE: An increase or  decrease  of 30% or more on serial  testing suggests a  clinically important change)    22:03   Troponin I  0.18 (0.00-0.05 0.05 ng/mL or less: NEGATIVE  Repeat testing in 3-6 hrs  if clinically indicated. >0.05 ng/mL: POTENTIAL  MYOCARDIAL INJURY. Repeat  testing in 3-6 hrs if  clinically indicated. NOTE: An increase or decrease  of 30% or more on serial  testing suggests a  clinically important change)  Routine Hem:  10-Nov-14 14:36   WBC (CBC) 8.6  RBC (CBC)  2.33  Hemoglobin (CBC)  7.2  Hematocrit (CBC)  22.0  Platelet Count (CBC)  53  MCV 94  MCH 30.8  MCHC 32.7  RDW  28.7   EKG:  Interpretation NSR, 1st degree AVB, inferior IVDCD, no ST/T changes   Rate 93   EKG Comparision Not changed from  2010 tracing   Radiology Results: XRay:    10-Nov-14 15:18, Chest PA and Lateral  Chest PA and Lateral  REASON FOR EXAM:    SOB  COMMENTS:       PROCEDURE: DXR - DXR CHEST PA (OR AP) AND LATERAL  - Jun 02 2013  3:18PM     CLINICAL DATA:  Shortness of breath, chest pain.    EXAM:  CHEST  2 VIEW    COMPARISON:  Chest radiograph 02/03/2013.    FINDINGS:  Stable cardiac and mediastinal contours with calcification and  tortuosity of the thoracic aorta. Patient is mildly rotated.  Unchanged biapical opacities. No large consolidative pulmonary  opacities. Osteopenia and mid thoracic spine degenerative change.     IMPRESSION:  No acute cardiopulmonary process.    No significant interval change biapical opacities potentially  representing scarring. Attention on followup to ensure stability.      Electronically Signed    By: Lovey Newcomer M.D.    On: 06/02/2013 15:29       Verified By: Ilsa Iha, M.D.,    Codeine: Unknown  Sulfa drugs: Unknown  Cipro: GI Distress  Vital Signs/Nurse's Notes: **Vital Signs.:   11-Nov-14 05:43  Vital Signs Type Routine  Temperature Temperature (F) 97.6  Celsius 36.4  Temperature Source oral  Pulse Pulse 84   Respirations Respirations 17  Systolic BP Systolic BP 474  Diastolic BP (mmHg) Diastolic BP (mmHg) 65  Mean BP 91  Pulse Ox % Pulse Ox % 91  Pulse Ox Activity Level  At rest  Oxygen Delivery Room Air/ 21 %    Impression 81yo female resident of [facility] assisted living w/ PMHx s/f myelodysplasia, chronic anemia/thrombocytopenia, asthma and hypothyroidism who was admitted to [facility] yesterday for shortness of breath.  1. Dyspnea on exertion, troponin elevation The patient has no prior cardiac history. With advanced age, do suspect some amount of coronary atherosclerosis. She denies frank chest pain. Endorses worsening DOE over the past several days. This resolved prior to presenting to the ED. Breathing improved this AM. There are no ischemic EKG changes. TnI trend indicates a mild plateau. Suspect demand ischemia secondary underlying anemia and asthma. She was recently treated for bronchitis. Wheezing appreciated on exam. No rales, JVD or peripheral edema. No evidence of pulmonary edema on CXR. Given advanced age, risk of aggressive ischemic evaluation outweighs benefit. Goal will be to maintain the QOL she currently enjoys at [facility].  -- Review 2D echo -- Check BNP, TSH -- Stop ASA given advanced age, severe anemia, thrombocytopenia. Unclear benefit of statin, BB.  -- No evidence of fluid overload on exam. Hold on diuretics for now.  -- Could consider transfusing to Hgb 9-10; however, patient's baseline Hgb has been ~7 for months  2. Asthma As above. Scattered wheezing on exam.  -- Management per primary team. Could consider nebs for ongoing wheezing.  3. MDS with associated anemia, thrombocytopenia Anemia may be contributing to increased cardiac demand.  -- Ongoing follow-up with hematology  4. Hypothyroidism -- Check TSH   Plan Attending Note: She is currently doing well.  she has DOE - quite possibly related to her chronic anemia.  her ECG is non acute.  I suspect  the mildly elevated Troponin levels are due to demand ischemia.  her anemia is likely a contributor to her supply / demand mismatch.  as such, I would DC her ASA as that may cause worsening of her anemia and actually worsen her symptoms.  None of her symptoms sound like an acute plaque rupture.   Electronic Signatures: Meriel Pica (PA-C)  (Signed 11-Nov-14 09:57)  Authored: General Aspect/Present Illness, History and Physical Exam, Review of System, Home Medications, Labs, EKG , Radiology, Allergies, Vital Signs/Nurse's Notes, Impression/Plan Nahser, Dreama Saa (MD)  (Signed 11-Nov-14 14:13)  Authored: Impression/Plan  Co-Signer: General Aspect/Present Illness, Home Medications, Labs, EKG , Radiology, Allergies, Vital Signs/Nurse's Notes, Impression/Plan   Last Updated: 11-Nov-14 14:13 by Acie Fredrickson Dreama Saa (MD)

## 2014-11-13 NOTE — H&P (Signed)
PATIENT NAME:  Jackie Hamilton, Jackie Hamilton MR#:  [id number] DATE OF BIRTH:  04/23/1913  DATE OF ADMISSION:  06/02/2013  PRIMARY CARE PHYSICIAN: Harvel Ricks, MD   CHIEF COMPLAINT: Shortness of breath.   HISTORY OF PRESENT ILLNESS: This is a 81 year old female who resides at [facility] assisted living who presents to the hospital due to shortness of breath that began earlier today. The patient says that she was in her usual state of health when this morning she became acutely short of breath on minimal exertion.   The patient says that she apparently took some medication. She cannot recall which one. Shortly after she took the medication, she developed the symptoms. She denies any chest pain or chest tightness. She denies any diaphoresis, palpitations, any syncope, dizziness, nausea, vomiting, or any other associated symptoms. She says her shortness of breath has significantly improved and now resolved. She was sent over to the ER for further evaluation. She was noted to have elevated troponin at 0.19. Hospitalist services were contacted for further treatment and evaluation.   REVIEW OF SYSTEMS: CONSTITUTIONAL: No documented fever. No weight gain or weight loss.  EYES: No blurred or double vision.  ENT: No tinnitus. No postnasal drip. No redness of the oropharynx.  RESPIRATORY: No cough. No wheeze. No hemoptysis. Positive dyspnea.  CARDIOVASCULAR: No chest pain, no orthopnea, no palpitations, no syncope.  GASTROINTESTINAL: No nausea, vomiting, or diarrhea. No abdominal pain. No melena or hematochezia.  GENITOURINARY: No dysuria or hematuria.  ENDOCRINE: No polyuria or nocturia. No heat or cold intolerance.  HEMATOLOGIC: Positive chronic anemia and thrombocytopenia. No acute bruising or bleeding.  INTEGUMENTARY: No rashes. No lesions.  MUSCULOSKELETAL: No arthritis. No swelling. No gout.  NEUROLOGIC: No numbness or tingling. No ataxia. No seizure-type activity.  PSYCHIATRIC: No anxiety. No insomnia.  No ADD.   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   ALLERGIES: CIPRO, CODEINE, AND SULFA DRUGS.   SOCIAL HISTORY: No smoking. No alcohol abuse. No illicit drug abuse. Lives at [facility] assisted living.   FAMILY HISTORY: Both mother and father are deceased. They both died from complications of old age. No family history of coronary artery disease or diabetes.   CURRENT MEDICATIONS: As follows: Flonase 2 sprays to each nostril daily, Synthroid 75 mcg daily, Tylenol 500 mg q.6 hours as needed, Mucinex 600 mg b.i.d., nystatin 5 mL four times daily as needed, albuterol inhaler 2 puffs t.i.d. as needed, QVAR 1 puff b.i.d., vitamin B12 1000 mcg daily, vitamin C 500 mg daily, vitamin D3 daily.   PHYSICAL EXAMINATION:  VITAL SIGNS: Temperature is 98.1, pulse 98, respirations 20, blood pressure 168/76, sats 97% on room air.  GENERAL: A pleasant-appearing female in no apparent distress.  HEENT: Atraumatic, normocephalic. Extraocular muscles are intact. Pupils equal and reactive noted to light. Sclerae anicteric. No conjunctival injection. No pharyngeal erythema.  NECK: Supple. There is no jugular venous distention. No bruits, no lymphadenopathy, no thyromegaly.  HEART: Regular rate and rhythm. No murmurs. No rubs. No clicks.  LUNGS: Clear to auscultation bilaterally. No rales, no rhonchi, no wheezes.  ABDOMEN: Soft, flat, nontender, nondistended. Has good bowel sounds. No hepatosplenomegaly appreciated.  EXTREMITIES: No evidence of any cyanosis, clubbing, or peripheral edema. Has +2 pedal and radial pulses bilaterally.  NEUROLOGICAL: Alert, awake, and oriented x3 with no focal motor or sensory deficits appreciated bilaterally.  SKIN: Moist and warm with no rashes appreciated.  LYMPHATIC: No cervical or axillary lymphadenopathy.   LABORATORY DATA:  Serum glucose 110, BUN 29, creatinine 1.08, sodium 137, potassium  4.3, chloride 107, bicarb 28, troponin 0.19. White cell count 8.6, hemoglobin 7.2, hematocrit 22, platelet count 3.   The patient did have a chest x-ray done which showed no acute cardiopulmonary disease. The patient also had an EKG done which showed normal sinus rhythm with normal axes and no evidence of any acute ST or T-wave changes.   ASSESSMENT AND PLAN: This is a 81 year old female with a history of asthma, chronic anemia, thrombocytopenia due to myelodysplasia, hypothyroidism, who presents to the hospital due to shortness of breath and noted to have an elevated troponin.  1.  Shortness of breath with elevated troponin. Questionable if this is a non-ST-elevation myocardial infarction versus an anginal attack or an anginal-equivalent symptom. The patient acutely had no chest pain, has no EKG changes, has no previous cardiac history. For now, observe her on telemetry, follow serial cardiac markers, place on baby aspirin, continue low-dose beta blocker and get a 2-dimensional echocardiogram and a cardiology consult. She likely is a candidate for conservative given her advanced age and her severe anemia, thrombocytopenia, as she cannot be anticoagulated.  2.  Anemia, thrombocytopenia. This is chronic for the patient and the patient follows with Dr. Grayland Ormond. She has a history of myelodysplasia. I do not see any evidence of acute need for transfusion presently. Although if she does become symptomatic or hemoglobin falls below 7, I will transfuse her given her possible acute cardiac event.  3.  Asthma. No acute exacerbation. Continue her QVAR. Continue albuterol inhaler as needed.  4.  Hypothyroidism. Continue Synthroid.   CODE STATUS: FULL CODE.   TIME SPENT ON ADMISSION: 50 minutes.   ____________________________ Belia Heman. Verdell Carmine, MD vjs:np D: 06/02/2013 18:05:50 ET T: 06/02/2013 18:58:18 ET JOB#: [id number]  cc: Belia Heman. Verdell Carmine, MD, <Dictator> Henreitta Leber MD ELECTRONICALLY SIGNED  06/11/2013 13:47

## 2014-11-13 NOTE — Discharge Summary (Signed)
PATIENT NAME:  Jackie Hamilton, Jackie Hamilton MR#:  [id number] DATE OF BIRTH:  20-Jun-1913  DATE OF ADMISSION:  06/02/2013 DATE OF DISCHARGE:  06/06/2013  PRIMARY CARE PHYSICIAN:  Dr. Harvel Ricks  DISCHARGE DIAGNOSES: 1.  Elevated troponin, demand cardiac ischemia.  2.  Dehydration.  3.  Aspirin.  4.  Anemia.  5.  Myelodysplasia. 6.  Chronic diastolic dysfunction with ejection fraction of 55% to 60%.  7.  Hypothyroidism.   CONDITION: Stable.   CODE STATUS: FULL CODE.   HOME MEDICATIONS: Please refer to the [facility] discharge instruction medication reconciliation list.   ACTIVITY: As tolerated.   DIET: Low sodium, low fat, low cholesterol diet.   FOLLOWUP CARE: Follow up with PCP within 1 to 2 weeks. Follow up with Dr. Rockey Situ within 1 to 2 weeks. Follow up with Dr. Grayland Ormond within 1 to 2 weeks. The patient's Synthroid was held due to decreased TSH.  The patient needs to follow up with PCP to adjust or resume the Synthroid.   REASON FOR ADMISSION: Shortness of breath.  CONSULTATIONS:  Cardiology, Dr. Rockey Situ and Hematology, Dr. Grayland Ormond.   HOSPITAL COURSE: The patient is a 81 year old Caucasian female with a history of asthma, chronic anemia, thrombocytopenia secondary to MDS, hypothyroidism who was sent from [facility] assisted living due to shortness of breath. The patient denies any chest pain. No chest tightness, no diaphoresis, syncope or dizziness, but the patient was noted to have elevated troponin of 0.19 so the patient was admitted for shortness of breath with elevated troponin. For detailed history and physical examination, please refer to the admission note dictated by Dr. Verdell Carmine. On admission date, laboratory data showed glucose 110 with BUN 29, creatinine 1.08, sodium 137, and potassium 4.3. Troponin 0.19. WBC 8.6, hemoglobin 7.2. Chest x-ray did not show any cardiopulmonary disease.  1.  Shortness of breath with elevated troponin possibly due to demand cardiac ischemia. According to  Dr. Donivan Scull consult, hold aspirin due to anemia and patient's age, but continue beta blocker.  Conservative management. The patient's echocardiogram showed normal function ejection fraction 55% to 60%.  2.  Anemia and thrombocytopenia likely related to MDS.  According to Dr. Gary Fleet suggestion, the patient got 1 unit of blood transfusion yesterday. Hemoglobin increased to 8.5.  3.  Asthma. The patient had mild wheezing on admission date, and the second day but after her nebulizer treatment, the patient's wheezing has improved.  4.  Dehydration. The patient's BUN was 29. After IV fluid support, the patient's BUN decreased to 23 and IV fluid was discontinued.  5.  According to physical therapy evaluation, the patient needs a skilled nursing facility placement. The patient has no symptoms. Her vital signs are stable. She is clinically stable and will be discharged to a skilled nursing facility today. I discussed the patient's discharge plan with the patient and the patient's daughters, case Freight forwarder and nurse.   TIME SPENT: About 37 minutes.    ____________________________ Demetrios Loll, MD qc:dp D: 06/06/2013 10:06:14 ET T: 06/06/2013 10:29:54 ET JOB#: [id number]  cc: Demetrios Loll, MD, <Dictator> Demetrios Loll MD ELECTRONICALLY SIGNED 06/06/2013 15:44
